# Patient Record
Sex: Female | Born: 2000 | Race: Black or African American | Hispanic: No | Marital: Single | Smoking: Never smoker
Health system: Southern US, Community
[De-identification: ages and names within clinical notes are randomized; demographics above are authoritative.]

---

## 2019-12-22 ENCOUNTER — Emergency Department (HOSPITAL_COMMUNITY): Payer: Medicaid Other

## 2019-12-22 ENCOUNTER — Emergency Department (HOSPITAL_COMMUNITY)
Admission: EM | Admit: 2019-12-22 | Discharge: 2019-12-22 | Disposition: A | Discharge status: Home / Self Care | Payer: Medicaid Other | Attending: Emergency Medicine | Admitting: Emergency Medicine

## 2019-12-22 ENCOUNTER — Other Ambulatory Visit (HOSPITAL_COMMUNITY): Payer: Medicaid Other

## 2019-12-22 ENCOUNTER — Other Ambulatory Visit: Payer: Self-pay

## 2019-12-22 ENCOUNTER — Encounter (HOSPITAL_COMMUNITY): Payer: Self-pay | Admitting: *Deleted

## 2019-12-22 DIAGNOSIS — O99891 Other specified diseases and conditions complicating pregnancy: Secondary | ICD-10-CM | POA: Insufficient documentation

## 2019-12-22 DIAGNOSIS — R102 Pelvic and perineal pain: Secondary | ICD-10-CM | POA: Diagnosis not present

## 2019-12-22 DIAGNOSIS — Z3A00 Weeks of gestation of pregnancy not specified: Secondary | ICD-10-CM | POA: Insufficient documentation

## 2019-12-22 DIAGNOSIS — R1032 Left lower quadrant pain: Secondary | ICD-10-CM | POA: Diagnosis not present

## 2019-12-22 DIAGNOSIS — Z3491 Encounter for supervision of normal pregnancy, unspecified, first trimester: Secondary | ICD-10-CM

## 2019-12-22 LAB — URINALYSIS, ROUTINE W REFLEX MICROSCOPIC
Bilirubin Urine: NEGATIVE
Glucose, UA: NEGATIVE mg/dL
Hgb urine dipstick: NEGATIVE
Ketones, ur: NEGATIVE mg/dL
Leukocytes,Ua: NEGATIVE
Nitrite: NEGATIVE
Protein, ur: NEGATIVE mg/dL
Specific Gravity, Urine: 1.01 (ref 1.005–1.030)
pH: 7 (ref 5.0–8.0)

## 2019-12-22 LAB — BASIC METABOLIC PANEL
Anion gap: 9 (ref 5–15)
BUN: 15 mg/dL (ref 6–20)
CO2: 25 mmol/L (ref 22–32)
Calcium: 9.2 mg/dL (ref 8.9–10.3)
Chloride: 103 mmol/L (ref 98–111)
Creatinine, Ser: 0.72 mg/dL (ref 0.44–1.00)
GFR calc Af Amer: >60 mL/min (ref >60)
GFR calc non Af Amer: >60 mL/min (ref >60)
Glucose, Bld: 89 mg/dL (ref 70–99)
Potassium: 4.3 mmol/L (ref 3.5–5.1)
Sodium: 137 mmol/L (ref 135–145)

## 2019-12-22 LAB — CBC WITH DIFFERENTIAL/PLATELET
Abs Immature Granulocytes: 0.01 10*3/uL (ref 0.00–0.07)
Basophils Absolute: 0 10*3/uL (ref 0.0–0.1)
Basophils Relative: 0 %
Eosinophils Absolute: 0.1 10*3/uL (ref 0.0–0.5)
Eosinophils Relative: 2 %
HCT: 37.9 % (ref 36.0–46.0)
Hemoglobin: 11.8 g/dL — ABNORMAL LOW (ref 12.0–15.0)
Immature Granulocytes: 0 %
Lymphocytes Relative: 32 %
Lymphs Abs: 1.7 10*3/uL (ref 0.7–4.0)
MCH: 28.3 pg (ref 26.0–34.0)
MCHC: 31.1 g/dL (ref 30.0–36.0)
MCV: 90.9 fL (ref 80.0–100.0)
Monocytes Absolute: 0.5 10*3/uL (ref 0.1–1.0)
Monocytes Relative: 9 %
Neutro Abs: 3 10*3/uL (ref 1.7–7.7)
Neutrophils Relative %: 57 %
Platelets: 221 10*3/uL (ref 150–400)
RBC: 4.17 MIL/uL (ref 3.87–5.11)
RDW: 13 % (ref 11.5–15.5)
WBC: 5.4 10*3/uL (ref 4.0–10.5)
nRBC: 0 % (ref 0.0–0.2)

## 2019-12-22 LAB — HCG, QUANTITATIVE, PREGNANCY: hCG, Beta Chain, Quant, S: 24539 m[IU]/mL — ABNORMAL HIGH (ref <5)

## 2019-12-22 NOTE — ED Provider Notes (Addendum)
[facility]-EMERGENCY DEPT Provider Note   CSN: [id number] Arrival date & time: 12/22/19  9528     History Chief Complaint  Patient presents with   Abdominal Pain    1 month pregnant    Maria Joseph is a 19 y.o. female.  G1 P0 LMP mid November presents with sharp left pelvic pain since early this morning.  No vaginal bleeding, dysuria, fever or discharge.  Severity is mild to moderate.  Palpation makes pain worse.        History reviewed. No pertinent past medical history.  There are no problems to display for this patient.   History reviewed. No pertinent surgical history.   OB History    Gravida  1   Para      Term      Preterm      AB      Living        SAB      TAB      Ectopic      Multiple      Live Births              No family history on file.  Social History   Tobacco Use   Smoking status: Never Smoker   Smokeless tobacco: Never Used  Substance Use Topics   Alcohol use: Not Currently   Drug use: Not on file    Home Medications Prior to Admission medications   Not on File    Allergies    Penicillins  Review of Systems   Review of Systems  All other systems reviewed and are negative.   Physical Exam Updated Vital Signs BP 125/66 (BP Location: Right Arm)    Pulse 66    Temp 97.8 F (36.6 C) (Oral)    Resp 16    Ht 5\' 5"  (1.651 m)    Wt 49.9 kg    LMP 11/12/2019    SpO2 100%    BMI 18.30 kg/m   Physical Exam Vitals and nursing note reviewed.  Constitutional:      Appearance: She is well-developed.     Comments: nad  HENT:     Head: Normocephalic and atraumatic.  Eyes:     Conjunctiva/sclera: Conjunctivae normal.  Cardiovascular:     Rate and Rhythm: Normal rate and regular rhythm.  Pulmonary:     Effort: Pulmonary effort is normal.     Breath sounds: Normal breath sounds.  Abdominal:     General: Bowel sounds are normal.     Palpations: Abdomen is soft.     Comments: Minimal  left lower quadrant tenderness  Musculoskeletal:        General: Normal range of motion.     Cervical back: Neck supple.  Skin:    General: Skin is warm and dry.  Neurological:     General: No focal deficit present.     Mental Status: She is alert and oriented to person, place, and time.  Psychiatric:        Behavior: Behavior normal.     ED Results / Procedures / Treatments   Labs (all labs ordered are listed, but only abnormal results are displayed) Labs Reviewed  CBC WITH DIFFERENTIAL/PLATELET - Abnormal; Notable for the following components:      Result Value   Hemoglobin 11.8 (*)    All other components within normal limits  HCG, QUANTITATIVE, PREGNANCY - Abnormal; Notable for the following components:   hCG, Beta Chain, Quant, S 24,539 (*)  All other components within normal limits  URINALYSIS, ROUTINE W REFLEX MICROSCOPIC - Abnormal; Notable for the following components:   Color, Urine STRAW (*)    All other components within normal limits  BASIC METABOLIC PANEL    EKG None  Radiology US OB LESS THAN 14 WEEKS WITH OB TRANSVAGINAL  Result Date: 12/22/2019 CLINICAL DATA:  Left lower quadrant pain, positive beta HCG EXAM: OBSTETRIC <14 WK [place] AND TRANSVAGINAL OB US TECHNIQUE: Both transabdominal and transvaginal ultrasound examinations were performed for complete evaluation of the gestation as well as the maternal uterus, adnexal regions, and pelvic cul-de-sac. Transvaginal technique was performed to assess early pregnancy. COMPARISON:  None. FINDINGS: Intrauterine gestational sac: Single Yolk sac:  Visualized. Embryo:  Visualized. Cardiac Activity: Visualized. Heart Rate: 187 bpm CRL:  3.1 mm   5 w   6 d                  [place] EDC: 08/17/2020 Subchorionic hemorrhage:  None visualized. Maternal uterus/adnexae: Complex appearing lesion within the right ovary measuring up to 1.4 cm with peripheral vascularity may represent a developing corpus luteal cyst versus a hemorrhagic cyst.  Ovaries and adnexal regions are otherwise unremarkable. No adnexal mass. Trace amount of fluid within the cul-de-sac. IMPRESSION: 1. Single live intrauterine gestation measuring 5 weeks 6 days by crown-rump length. 2. Elevated embryonic heart rate of 187 beats per minute. 3. Complex 1.4 cm area within the right ovary likely developing corpus luteal cyst. Electronically Signed   By: Davina Poke D.O.   On: 12/22/2019 11:50    Procedures Procedures (including critical care time)  Medications Ordered in ED Medications - No data to display  ED Course  I have reviewed the triage vital signs and the nursing notes.  Pertinent labs & imaging results that were available during my care of the patient were reviewed by me and considered in my medical decision making (see chart for details).    MDM Rules/Calculators/A&P                      History and physical could suggest an ectopic pregnancy.  Quantitative hCG and OB ultrasound.  1400: Recheck.  Quantitative hCG greater than 24,000.  Ultrasound reveals an IUP at 5 and 6/[redacted] weeks gestation.  Patient encouraged to get OB/GYN follow-up. Final Clinical Impression(s) / ED Diagnoses Final diagnoses:  Left lower quadrant abdominal pain  Normal IUP (intrauterine pregnancy) on prenatal ultrasound, first trimester    Rx / DC Orders ED Discharge Orders    None       Nat Christen, MD 12/22/19 7858    Nat Christen, MD 12/22/19 1400

## 2019-12-22 NOTE — ED Triage Notes (Signed)
Pt complains of left lower abdominal/groin pain for the past few days. Pain became worse today. Pt has had some nausea, no emesis or diarrhea. Pt is 1 month pregnant. Pt has not seen OB since finding out.

## 2019-12-22 NOTE — Discharge Instructions (Addendum)
Ultrasound shows a 5 and 6/[redacted] week gestation fetus.  Tylenol for pain.  You will need OB/GYN follow-up.  Phone number given.

## 2019-12-22 NOTE — ED Notes (Signed)
Pt ambulated to bathroom with no assistance.  

## 2019-12-22 NOTE — ED Notes (Signed)
Pt made aware of need for urine sample. Provided water to drink.

## 2020-01-01 ENCOUNTER — Encounter: Payer: Self-pay | Admitting: Family Medicine

## 2020-01-01 ENCOUNTER — Other Ambulatory Visit: Payer: Self-pay

## 2020-01-01 ENCOUNTER — Ambulatory Visit: Payer: Medicaid Other | Admitting: *Deleted

## 2020-01-01 NOTE — Progress Notes (Signed)
Pt came in today to for upt. In reviewing chart pt had u/s on 12/22/19 showing iup of [redacted]w[redacted]d with EDC of 08/17/20. Nurse visit appt not needed. Pt informed by front desk and will make appt to begin pnc.

## 2020-01-01 NOTE — Progress Notes (Signed)
Chart reviewed for nurse visit. Agree with plan of care.   Judeth Horn, NP 01/01/2020 3:05 PM

## 2020-11-30 ENCOUNTER — Emergency Department (HOSPITAL_COMMUNITY)
Admission: EM | Admit: 2020-11-30 | Discharge: 2020-11-30 | Disposition: A | Discharge status: Home / Self Care | Payer: Medicaid Other | Attending: Emergency Medicine | Admitting: Emergency Medicine

## 2020-11-30 ENCOUNTER — Encounter (HOSPITAL_COMMUNITY): Payer: Self-pay

## 2020-11-30 ENCOUNTER — Other Ambulatory Visit: Payer: Self-pay

## 2020-11-30 DIAGNOSIS — U071 COVID-19: Secondary | ICD-10-CM | POA: Diagnosis not present

## 2020-11-30 DIAGNOSIS — M791 Myalgia, unspecified site: Secondary | ICD-10-CM | POA: Diagnosis present

## 2020-11-30 LAB — RESP PANEL BY RT-PCR (FLU A&B, COVID) ARPGX2
Influenza A by PCR: NEGATIVE
Influenza B by PCR: NEGATIVE
SARS Coronavirus 2 by RT PCR: POSITIVE — AB

## 2020-11-30 MED ORDER — ACETAMINOPHEN 325 MG PO TABS
650.0000 mg | ORAL_TABLET | Freq: Once | ORAL | Status: AC
  Administered 2020-11-30: 650 mg via ORAL
  Filled 2020-11-30: qty 2

## 2020-11-30 NOTE — ED Triage Notes (Signed)
Pt sts headache and generalized body aches since last night.

## 2020-11-30 NOTE — ED Provider Notes (Signed)
[facility]-EMERGENCY DEPT Provider Note   CSN: [id number] Arrival date & time: 11/30/20  4098     History Chief Complaint  Patient presents with  . Generalized Body Aches    Maria Joseph is a 19 y.o. female.  HPI She complains of illness since yesterday characterized by myalgias and low-grade fever.  She denies sore throat, loss of taste or smell, focal weakness or paresthesia.  No known sick contacts.  She has a young child at home who is not currently ill.  She has not been vaccinated.  There are no other known modifying factors.    History reviewed. No pertinent past medical history.  There are no problems to display for this patient.   History reviewed. No pertinent surgical history.   OB History    Gravida  1   Para      Term      Preterm      AB      Living        SAB      IAB      Ectopic      Multiple      Live Births              No family history on file.  Social History   Tobacco Use  . Smoking status: Never Smoker  . Smokeless tobacco: Never Used  Substance Use Topics  . Alcohol use: Not Currently    Home Medications Prior to Admission medications   Not on File    Allergies    Penicillins  Review of Systems   Review of Systems  All other systems reviewed and are negative.   Physical Exam Updated Vital Signs BP 106/68 (BP Location: Left Arm)   Pulse (!) 102   Temp (!) 100.4 F (38 C) (Oral)   Resp 14   Ht 5\' 5"  (1.651 m)   Wt 54.4 kg   SpO2 96%   BMI 19.97 kg/m   Physical Exam Vitals and nursing note reviewed.  Constitutional:      General: She is not in acute distress.    Appearance: She is well-developed and well-nourished. She is not ill-appearing, toxic-appearing or diaphoretic.  HENT:     Head: Normocephalic and atraumatic.     Right Ear: External ear normal.     Left Ear: External ear normal.  Eyes:     Extraocular Movements: EOM normal.     Conjunctiva/sclera:  Conjunctivae normal.     Pupils: Pupils are equal, round, and reactive to light.  Neck:     Trachea: Phonation normal.  Cardiovascular:     Rate and Rhythm: Normal rate and regular rhythm.  Pulmonary:     Effort: Pulmonary effort is normal. No respiratory distress.     Breath sounds: No stridor. No rhonchi.  Chest:     Chest wall: No bony tenderness.  Abdominal:     General: There is no distension.     Palpations: Abdomen is soft.  Musculoskeletal:        General: Normal range of motion.     Cervical back: Normal range of motion and neck supple.  Skin:    General: Skin is warm, dry and intact.  Neurological:     Mental Status: She is alert and oriented to person, place, and time.     Cranial Nerves: No cranial nerve deficit.     Sensory: No sensory deficit.     Motor: No abnormal muscle tone.  Coordination: Coordination normal.  Psychiatric:        Mood and Affect: Mood and affect and mood normal.        Behavior: Behavior normal.        Thought Content: Thought content normal.        Judgment: Judgment normal.     ED Results / Procedures / Treatments   Labs (all labs ordered are listed, but only abnormal results are displayed) Labs Reviewed  RESP PANEL BY RT-PCR (FLU A&B, COVID) [id number]    EKG None  Radiology No results found.  Procedures Procedures (including critical care time)  Medications Ordered in ED Medications - No data to display  ED Course  I have reviewed the triage vital signs and the nursing notes.  Pertinent labs & imaging results that were available during my care of the patient were reviewed by me and considered in my medical decision making (see chart for details).    MDM Rules/Calculators/A&P                           Patient Vitals for the past 24 hrs:  BP Temp Temp src Pulse Resp SpO2 Height Weight  11/30/20 1116 (!) 98/53 100.1 F (37.8 C) Oral 91 18 98 % -- --  11/30/20 0915 (!) 108/59 -- -- 83 (!) 27 97 % -- --  11/30/20  0702 106/68 (!) 100.4 F (38 C) Oral (!) 102 14 96 % 5\' 5"  (1.651 m) 54.4 kg    At time of discharge- reevaluation with update and discussion. After initial assessment and treatment, an updated evaluation reveals she is comfortable and has no further complaints.   Medical Decision Making:  This patient is presenting for evaluation of acute illness, which does require a range of treatment options, and is a complaint that involves a moderate risk of morbidity and mortality. The differential diagnoses include viral illness, bacterial illness, allergic reaction. I decided to review old records, and in summary Young female with a brief illness, associated with fever.  I did not require additional historical information from anyone.  Clinical Laboratory Tests Ordered, included Covid test. Review indicates positive Covid negative flu.      Critical Interventions-clinic evaluation, laboratory testing, observation reassessment  After These Interventions, the Patient was reevaluated and was found stable for discharge. Patient with pandemic infection, Covid. No indication for hospitalization, or further treatment at this time.  Maria Joseph was evaluated in Emergency Department on 11/30/2020 for the symptoms described in the history of present illness. She was evaluated in the context of the global COVID-19 pandemic, which necessitated consideration that the patient might be at risk for infection with the SARS-CoV-2 virus that causes COVID-19. Institutional protocols and algorithms that pertain to the evaluation of patients at risk for COVID-19 are in a state of rapid change based on information released by regulatory bodies including the CDC and federal and state organizations. These policies and algorithms were followed during the patient's care in the ED.  CRITICAL CARE-no Performed by: 14/10/2020  Nursing Notes Reviewed/ Care Coordinated Applicable Imaging  Reviewed Interpretation of Laboratory Data incorporated into ED treatment  The patient appears reasonably screened and/or stabilized for discharge and I doubt any other medical condition or other [facility] requiring further screening, evaluation, or treatment in the ED at this time prior to discharge.  Plan: Home Medications-routine OTC medications; Home Treatments-rest, fluids; return here if the recommended treatment, does not improve the  symptoms; Recommended follow up-PCP, as needed     Final Clinical Impression(s) / ED Diagnoses Final diagnoses:  None    Rx / DC Orders ED Discharge Orders    None       Mancel Bale, MD 11/30/20 2052

## 2020-11-30 NOTE — Discharge Instructions (Addendum)
It is important to isolate yourself from anyone else, until your symptoms are gone +2 days.  Use Tylenol for fever or pain.  Use Robitussin-DM for cough.  Make sure you get plenty of rest, drink a lot of fluids and try to eat 3 meals each day.

## 2020-12-01 ENCOUNTER — Telehealth (HOSPITAL_COMMUNITY): Payer: Self-pay | Admitting: Nurse Practitioner

## 2020-12-01 DIAGNOSIS — U071 COVID-19: Secondary | ICD-10-CM

## 2020-12-01 NOTE — Telephone Encounter (Signed)
Called to discuss with Maria Joseph about Covid symptoms and the use of a monoclonal antibody infusion for those with mild to moderate Covid symptoms and at a high risk of hospitalization.     Pt is qualified for this infusion at the [facility] center due to co-morbid conditions and/or a member of an at-risk group, however declines infusion at this time.  Symptoms tier reviewed as well as criteria for ending isolation.  Symptoms reviewed that would warrant ED/Hospital evaluation. Preventative practices reviewed. Patient verbalized understanding. Patient advised to call back if he/she opts to proceed with infusion. Callback number provided. Urgent care and/or ER precautions given for severe symptoms. Last date eligible for infusion: 12/08/20  There are no problems to display for this patient. SVI-4  Consuello Masse, NP [phone] [email or web address]

## 2021-10-21 ENCOUNTER — Emergency Department (HOSPITAL_BASED_OUTPATIENT_CLINIC_OR_DEPARTMENT_OTHER)
Admission: EM | Admit: 2021-10-21 | Discharge: 2021-10-21 | Disposition: A | Discharge status: Home / Self Care | Payer: Medicaid Other | Attending: Student | Admitting: Student

## 2021-10-21 ENCOUNTER — Other Ambulatory Visit: Payer: Self-pay

## 2021-10-21 ENCOUNTER — Emergency Department (HOSPITAL_BASED_OUTPATIENT_CLINIC_OR_DEPARTMENT_OTHER): Payer: Medicaid Other

## 2021-10-21 ENCOUNTER — Encounter (HOSPITAL_BASED_OUTPATIENT_CLINIC_OR_DEPARTMENT_OTHER): Payer: Self-pay | Admitting: *Deleted

## 2021-10-21 ENCOUNTER — Emergency Department (HOSPITAL_BASED_OUTPATIENT_CLINIC_OR_DEPARTMENT_OTHER): Payer: Medicaid Other | Admitting: Radiology

## 2021-10-21 DIAGNOSIS — R079 Chest pain, unspecified: Secondary | ICD-10-CM | POA: Insufficient documentation

## 2021-10-21 DIAGNOSIS — M940 Chondrocostal junction syndrome [Tietze]: Secondary | ICD-10-CM

## 2021-10-21 LAB — CBC
HCT: 33.9 % — ABNORMAL LOW (ref 36.0–46.0)
Hemoglobin: 10.8 g/dL — ABNORMAL LOW (ref 12.0–15.0)
MCH: 27.3 pg (ref 26.0–34.0)
MCHC: 31.9 g/dL (ref 30.0–36.0)
MCV: 85.8 fL (ref 80.0–100.0)
Platelets: 186 10*3/uL (ref 150–400)
RBC: 3.95 MIL/uL (ref 3.87–5.11)
RDW: 13.2 % (ref 11.5–15.5)
WBC: 4.7 10*3/uL (ref 4.0–10.5)
nRBC: 0 % (ref 0.0–0.2)

## 2021-10-21 LAB — BASIC METABOLIC PANEL
Anion gap: 7 (ref 5–15)
BUN: 13 mg/dL (ref 6–20)
CO2: 27 mmol/L (ref 22–32)
Calcium: 8.9 mg/dL (ref 8.9–10.3)
Chloride: 104 mmol/L (ref 98–111)
Creatinine, Ser: 0.82 mg/dL (ref 0.44–1.00)
GFR, Estimated: >60 mL/min (ref >60)
Glucose, Bld: 87 mg/dL (ref 70–99)
Potassium: 3.9 mmol/L (ref 3.5–5.1)
Sodium: 138 mmol/L (ref 135–145)

## 2021-10-21 LAB — TROPONIN I (HIGH SENSITIVITY): Troponin I (High Sensitivity): <2 ng/L (ref <18)

## 2021-10-21 MED ORDER — NAPROXEN 500 MG PO TABS: 500.0000 mg | ORAL_TABLET | Freq: Two times a day (BID) | ORAL | 0 refills | Status: AC

## 2021-10-21 MED ORDER — LIDOCAINE 5 % EX PTCH
1.0000 | MEDICATED_PATCH | CUTANEOUS | Status: DC
  Administered 2021-10-21: 1 via TRANSDERMAL
  Filled 2021-10-21: qty 1

## 2021-10-21 MED ORDER — IOHEXOL 300 MG/ML  SOLN
75.0000 mL | Freq: Once | INTRAMUSCULAR | Status: AC | PRN
  Administered 2021-10-21: 75 mL via INTRAVENOUS

## 2021-10-21 NOTE — ED Provider Notes (Signed)
[facility] EMERGENCY DEPT Provider Note   CSN: [id number] Arrival date & time: 10/21/21  1546     History Chief Complaint  Patient presents with   Chest Pain    Maria Joseph is a 20 y.o. female who presents the emergency department for evaluation of chest pain.  Patient states that she has had a palpable tender mass along the rib line on the left near the sternum that she feels has been getting bigger.  Pain is worse with palpation but she has no associated exertional shortness of breath, diaphoresis, nausea or vomiting.  Denies cough, diarrhea, fever or other systemic symptoms.   Chest Pain Associated symptoms: no abdominal pain, no back pain, no cough, no fever, no palpitations, no shortness of breath and no vomiting       History reviewed. No pertinent past medical history.  There are no problems to display for this patient.   History reviewed. No pertinent surgical history.   OB History     Gravida  1   Para      Term      Preterm      AB      Living         SAB      IAB      Ectopic      Multiple      Live Births              No family history on file.  Social History   Tobacco Use   Smoking status: Never   Smokeless tobacco: Never  Vaping Use   Vaping Use: Never used  Substance Use Topics   Alcohol use: Yes    Comment: occ   Drug use: Never    Home Medications Prior to Admission medications   Not on File    Allergies    Penicillins  Review of Systems   Review of Systems  Constitutional:  Negative for chills and fever.  HENT:  Negative for ear pain and sore throat.   Eyes:  Negative for pain and visual disturbance.  Respiratory:  Negative for cough and shortness of breath.   Cardiovascular:  Positive for chest pain. Negative for palpitations.  Gastrointestinal:  Negative for abdominal pain and vomiting.  Genitourinary:  Negative for dysuria and hematuria.  Musculoskeletal:  Negative for arthralgias and  back pain.  Skin:  Negative for color change and rash.  Neurological:  Negative for seizures and syncope.  All other systems reviewed and are negative.  Physical Exam Updated Vital Signs BP 117/79 (BP Location: Right Arm)   Pulse 75   Temp 98.2 F (36.8 C) (Oral)   Resp 15   Ht 5\' 5"  (1.651 m)   Wt 50.8 kg   LMP  (LMP Unknown)   SpO2 100%   BMI 18.64 kg/m   Physical Exam Vitals and nursing note reviewed.  Constitutional:      General: She is not in acute distress.    Appearance: She is well-developed.  HENT:     Head: Normocephalic and atraumatic.  Eyes:     Conjunctiva/sclera: Conjunctivae normal.  Cardiovascular:     Rate and Rhythm: Normal rate and regular rhythm.     Heart sounds: No murmur heard. Pulmonary:     Effort: Pulmonary effort is normal. No respiratory distress.     Breath sounds: Normal breath sounds.  Chest:     Chest wall: Mass (Tender, firm mass at the insertion point of the rib into  the sternum on the left) present.  Abdominal:     Palpations: Abdomen is soft.     Tenderness: There is no abdominal tenderness.  Musculoskeletal:     Cervical back: Neck supple.  Skin:    General: Skin is warm and dry.  Neurological:     Mental Status: She is alert.    ED Results / Procedures / Treatments   Labs (all labs ordered are listed, but only abnormal results are displayed) Labs Reviewed  CBC - Abnormal; Notable for the following components:      Result Value   Hemoglobin 10.8 (*)    HCT 33.9 (*)    All other components within normal limits  BASIC METABOLIC PANEL  PREGNANCY, URINE  TROPONIN I (HIGH SENSITIVITY)  TROPONIN I (HIGH SENSITIVITY)    EKG None  Radiology DG Chest 2 View  Result Date: 10/21/2021 CLINICAL DATA:  Left-sided chest pain. EXAM: CHEST - 2 VIEW COMPARISON:  None. FINDINGS: Normal heart, mediastinum and hila. Clear lungs.  No pleural effusion or pneumothorax. Skeletal structures are unremarkable. IMPRESSION: Normal chest  radiographs. Electronically Signed   By: Amie Portland M.D.   On: 10/21/2021 16:24    Procedures Procedures   Medications Ordered in ED Medications - No data to display  ED Course  I have reviewed the triage vital signs and the nursing notes.  Pertinent labs & imaging results that were available during my care of the patient were reviewed by me and considered in my medical decision making (see chart for details).    MDM Rules/Calculators/A&P                           Patient seen emergency department for evaluation of chest pain.  Physical exam reveals a palpable tender nodule on the left at the insertion point of the rib into the sternum but cardiopulmonary exam otherwise unremarkable.  Laboratory evaluation unremarkable.  Chest x-ray unremarkable and a CT chest was performed to better characterize the chest wall lesion felt on physical exam and this CT was unremarkable.  A Lidoderm patch was placed and the patient had improvement of symptoms.  Concern the patient currently has costochondritis and she was discharged with naproxen.  She was given strict return precautions of which she voiced understanding. Final Clinical Impression(s) / ED Diagnoses Final diagnoses:  None    Rx / DC Orders ED Discharge Orders     None        Yitzhak Awan, Wyn Forster, MD 10/21/21 2355

## 2021-10-21 NOTE — ED Triage Notes (Signed)
Chest pain on left side especially when touched, and feels like she has some swelling, sharp pain lasting about 2-3 minutes last night. No other noted sympotms

## 2021-10-21 NOTE — ED Notes (Signed)
Pt ambulatory from triage, no assistance needed.  

## 2021-10-21 NOTE — ED Notes (Signed)
D/c paperwork reviewed with pt, including prescription. All questions addressed prior to d/c. Pt ambulatory to ED exit.

## 2022-10-11 IMAGING — CT CT CHEST W/ CM
2 of 4 series · 15 of 36 positions shown, 18 images · IV contrast (APPLIED)
Comparison: Chest radiograph dated 10/21/2021.

CLINICAL DATA: Left-sided sternal mass/tenderness.

EXAM:
CT CHEST WITH CONTRAST
TECHNIQUE: Multidetector CT imaging of the chest was performed during
intravenous contrast administration.
CONTRAST:  75mL OMNIPAQUE IOHEXOL 300 MG/ML  SOLN

[Series 2: routine chest with · axial · 0.56mm/px · z∈[-226,+38]mm · 12 of 156 slices shown, 15 images]
[im 12/156  mediastinal]
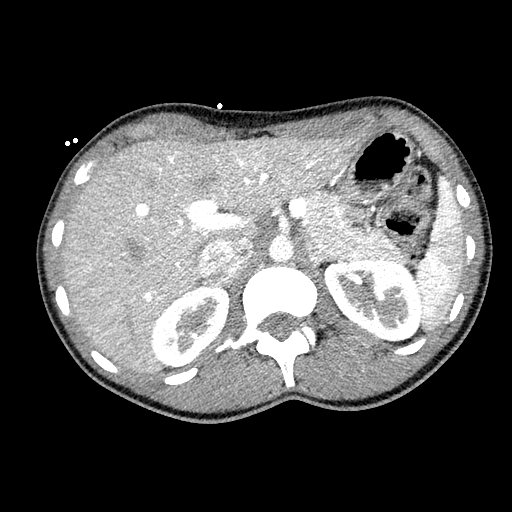
[im 12/156  lung]
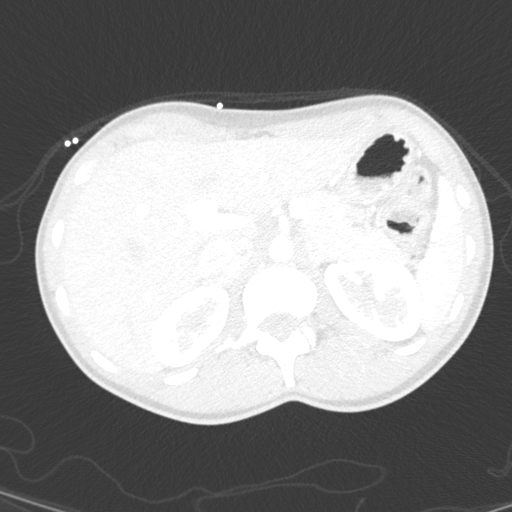
[im 24/156  lung]
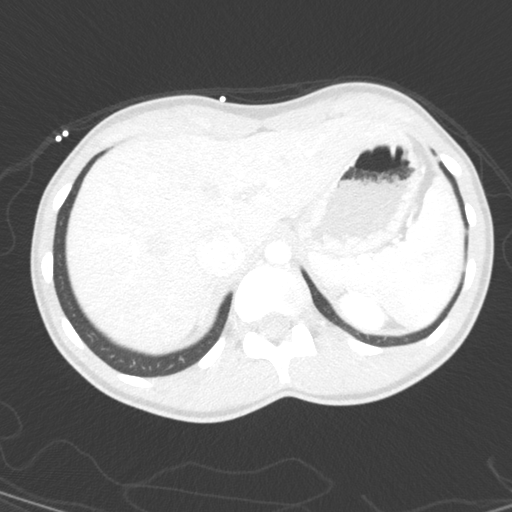
[im 36/156  lung]
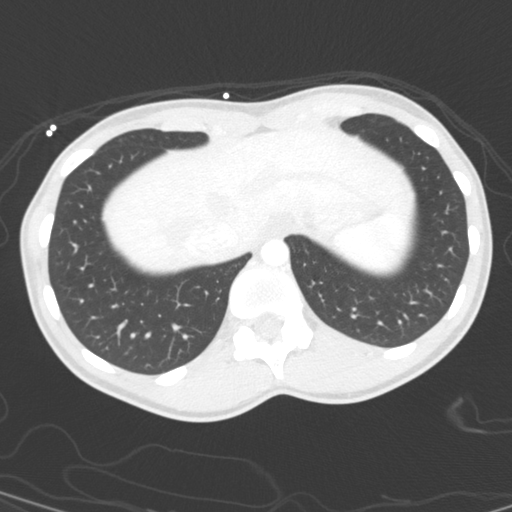
[im 48/156  lung]
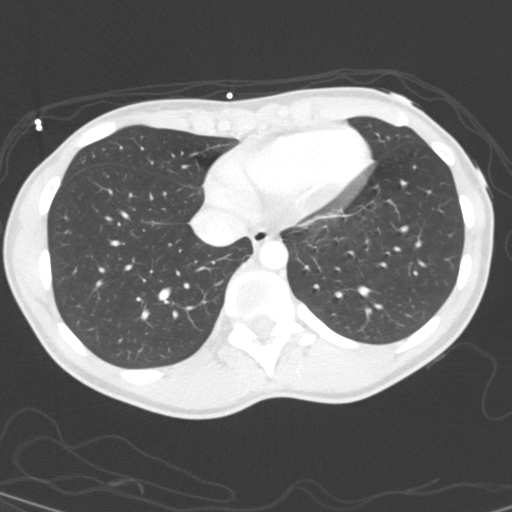
[im 60/156  mediastinal]
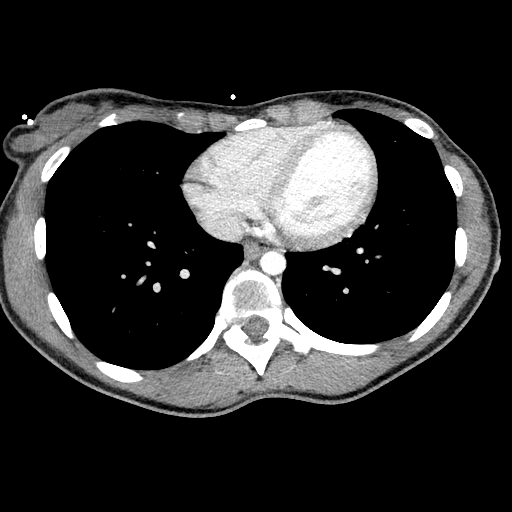
[im 60/156  lung]
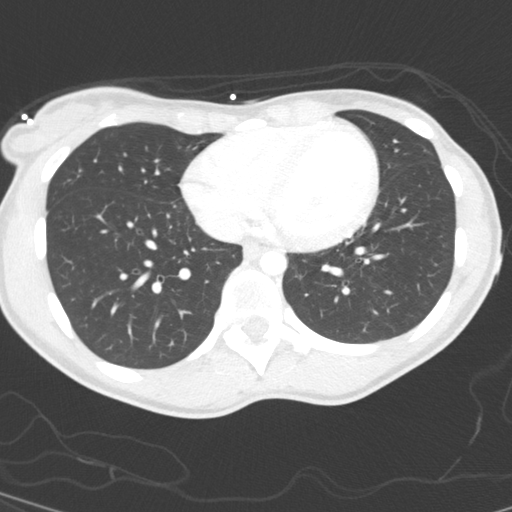
[im 72/156  lung]
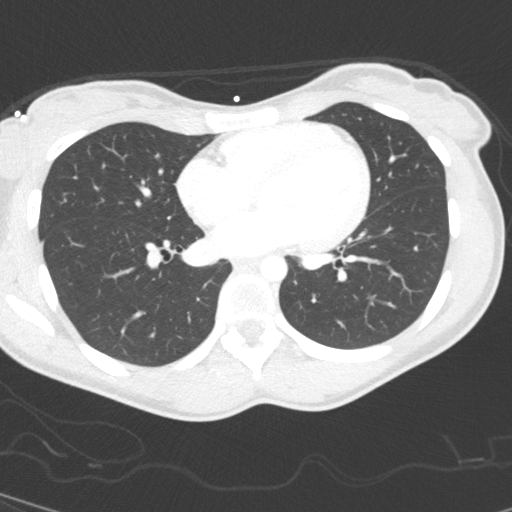
[im 84/156  lung]
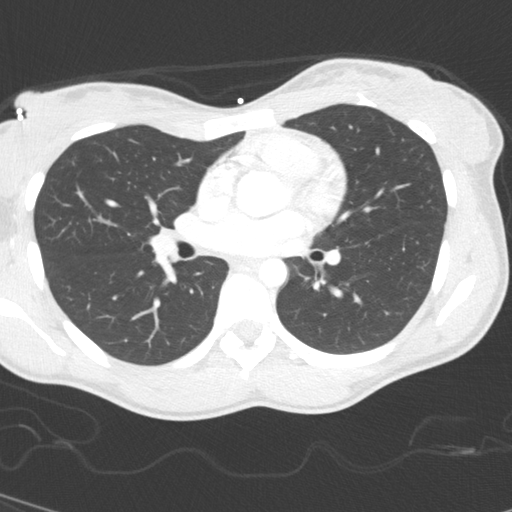
[im 96/156  lung]
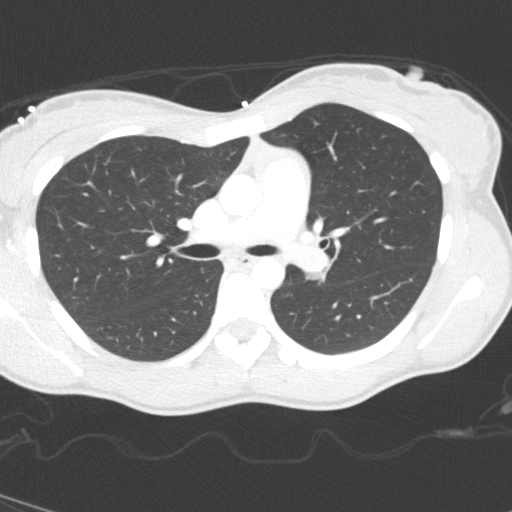
[im 108/156  mediastinal]
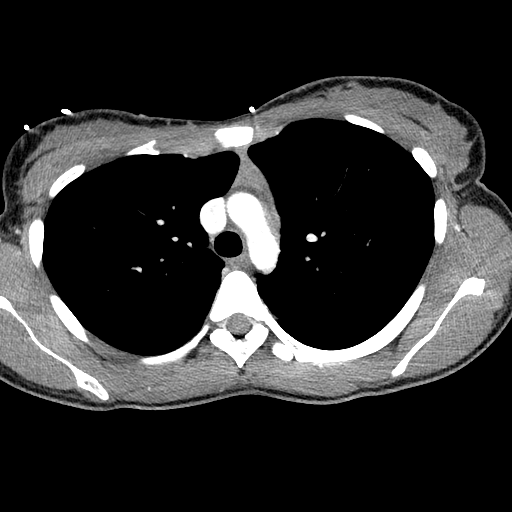
[im 108/156  lung]
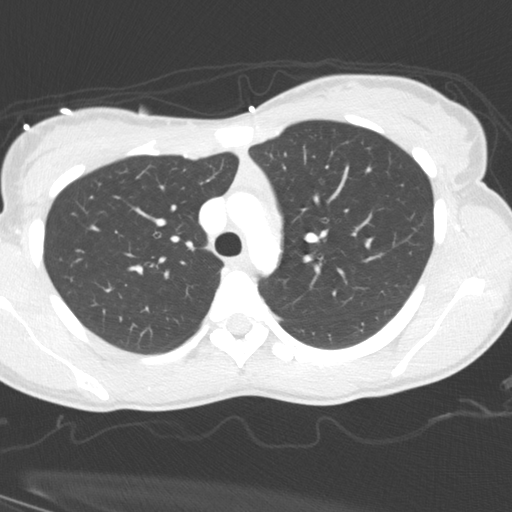
[im 120/156  lung]
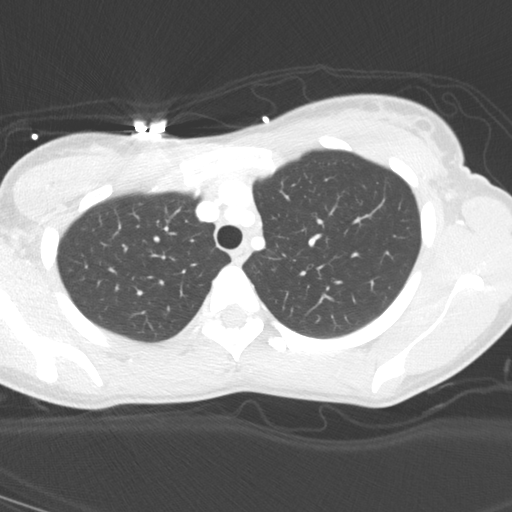
[im 132/156  lung]
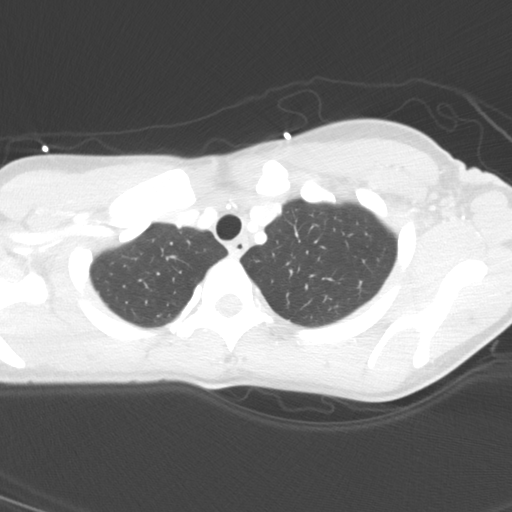
[im 144/156  lung]
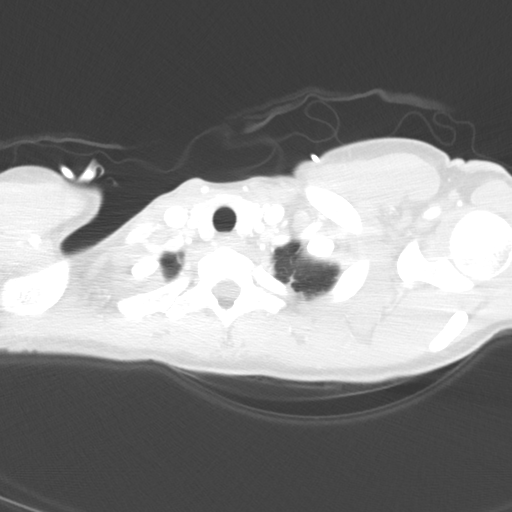

[Series 5: coronal · coronal · 0.56mm/px · 3 of 103 slices shown]
[im 21/103  lung]
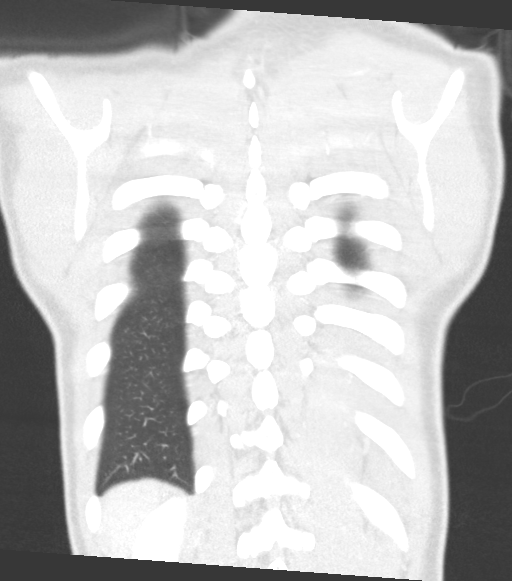
[im 41/103  lung]
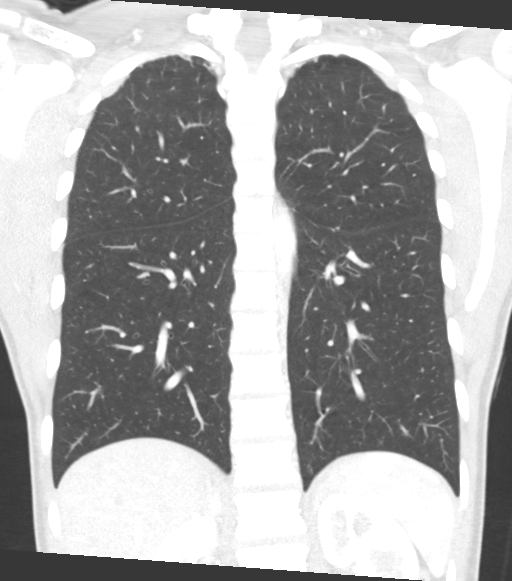
[im 62/103  lung]
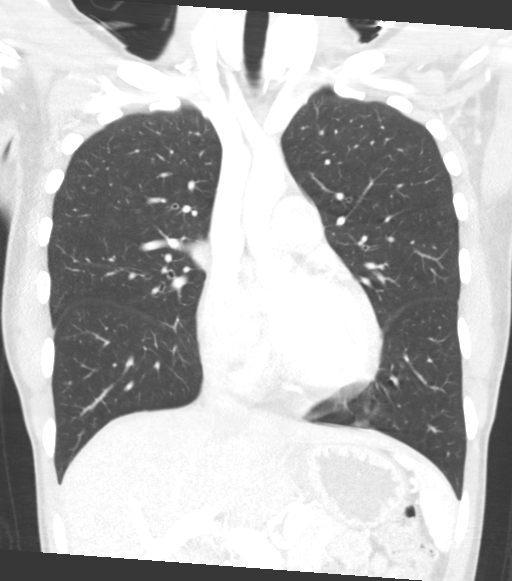

[15 of 36 positions shown; findings below may reference images not displayed]

FINDINGS: Cardiovascular: There is no cardiomegaly or pericardial effusion.
The thoracic aorta is unremarkable. The origins of the great vessels
of the aortic arch appear patent. The central pulmonary arteries are
unremarkable for the degree of opacification.

Mediastinum/Nodes: No hilar or mediastinal adenopathy. The esophagus
and the thyroid gland are grossly unremarkable. No mediastinal fluid
collection. Residual thymic tissue noted in the anterior
mediastinum.

Lungs/Pleura: No focal consolidation, pleural effusion, or
pneumothorax. The central airways are patent.

Upper Abdomen: No acute abnormality.

Musculoskeletal: No chest wall abnormality. No acute or significant
osseous findings.
IMPRESSION: No acute intrathoracic pathology.
